# Patient Record
Sex: Female | Born: 1985 | Race: White | Marital: Single | Smoking: Former smoker
Health system: Southern US, Community
[De-identification: ages and names within clinical notes are randomized; demographics above are authoritative.]

## PROBLEM LIST (undated history)

## (undated) HISTORY — PX: KNEE SURGERY: SHX244

## (undated) HISTORY — PX: ANKLE SURGERY: SHX546

---

## 2010-01-27 NOTE — ED Notes (Signed)
Presents to triage with c/o right flank/kidnet pain states this started day before yesterday. States that she also has pain and burining with urination. States that she has also had flu-like symptoms since yesterday.
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
## 2011-10-10 NOTE — Progress Notes (Signed)
Physical Therapy Daily Treatment Note    Patient Information  Ebony Griffin   December 31, 1985  [id number]     Referring Physician: Melvyn Neth, MD   Medical Diagnosis:knee, hip and ankle pain    DATE :10/10/2011    Subjective:   Patient reports pain level of 5 out of 10.  States she always has pain.  States she has started on Mobic and it is helping.    Summary List:  Patient denies any new medical diagnoses or changes in medical conditions since last visit.  Patient denies any operations or procedures since last visit.  Patient denies any new adverse or allergic drug reactions since last visit.  Patient denies any changes to medication list (herbals, prescription, & OTC).     Objective:   Please refer to today's Treatment Flowsheet for exercises / interventions performed today. These addressed any or all of the following: education / ROM / strength / flexibility / tissue healing / tissue mobility / joint mobility / edema control / pain control / postural awareness ( which are necessary to achieve pt's LTFG's).  Started on therapeutic ex in warm water as per flow sheet working on gt activities, jt motion and endurance activities to improve her adls and decrease her c/o pain.    Patient educated on continuing compliance with HEP.       Assessment:   Ebony Griffin is a 26 y.o. female who presents with symptoms consistent with knee, hip and ankle pain. she exhibits decreased ankle rom, increased pain with increased activity.  Tolerated today's session well.      Plan:   Plan to continue to see 2 x /week for 4 week/s for education, therapeutic exercise, manual therapy prn progressing as patient tolerates focusing on increasing strength, reducing deficits in order to improve overall function.        Harlene Ramus, PTA    Therapeutic Exercise (Timed Code): 20 minutes  Therapeutic Activity (Timed Code): 0 minutes  Manual Therapy (Timed Code): 0 minutes  Neuromuscular Re-Ed (Timed Code): 0 minutes  Total Time: 20 minutes
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
## 2011-10-12 NOTE — Progress Notes (Signed)
Physical Therapy Daily Treatment Note    Patient Information  Ebony Griffin   05-21-1986  [id number]     Referring Physician: Melvyn Neth, MD   Medical Diagnosis:knee, ankle and hip pain    DATE :10/12/2011    Subjective:   Patient reports being very sore after rx yesterday and knee is more tender, feels bruised.    Summary List:  Patient denies any new medical diagnoses or changes in medical conditions since last visit.  Patient denies any operations or procedures since last visit.  Patient denies any new adverse or allergic drug reactions since last visit.  Patient denies any changes to medication list (herbals, prescription, & OTC).     Objective:   Please refer to today's Treatment Flowsheet for exercises / interventions performed today. These addressed any or all of the following: education / ROM / strength / flexibility / tissue healing / tissue mobility / joint mobility / edema control / pain control / postural awareness ( which are necessary to achieve pt's LTFG's).  Cont with therapeutic ex in warm water as per flow sheet working on gt activities, balance and endurance to improve her adls and decrease her c/o pain.    Patient educated on continuing compliance with HEP.       Assessment:   Ebony Griffin is a 26 y.o. female who presents with symptoms consistent with knee, ankle and hip pain. She exhibits decreased endurance and stabilization.  She was very tender all around the knee today.  Tight le ms with stretching.  Tolerated today's session well.    Plan:   Plan to continue to see 3 x /week for 3 week/s for education, therapeutic exercise, manual therapy prn progressing as patient tolerates focusing on increasing strength, reducing deficits in order to improve overall function.        Harlene Ramus, PTA    Therapeutic Exercise (Timed Code): 20 minutes  Therapeutic Activity (Timed Code): 0 minutes  Manual Therapy (Timed Code): 0 minutes  Neuromuscular Re-Ed (Timed Code): 0 minutes  Total Time: 20  minutes

## 2011-10-25 NOTE — Progress Notes (Signed)
Called pts home and left a message to call re: cont P.T.

## 2011-10-26 NOTE — Progress Notes (Signed)
dnka

## 2011-11-02 NOTE — Progress Notes (Signed)
Ebony Griffin has been seen for 3 visits of P.T. Since her eval with 2 treatments in the water and 1 on land.  She didn't return for her follow up appts.  I called her home and left a message and she hasn't called to r/s.

## 2012-05-31 NOTE — Progress Notes (Signed)
Patient attended 4 PT visits then did not attend any additional PT visits. Patient's current status is unknown. Patient has been discharged due to non-compliance.

## 2014-09-14 ENCOUNTER — Encounter: Attending: Family | Primary: Family Medicine

## 2015-11-06 ENCOUNTER — Emergency Department: Payer: Self-pay

## 2015-11-06 ENCOUNTER — Emergency Department
Admission: EM | Admit: 2015-11-06 | Discharge: 2015-11-06 | Disposition: A | Discharge status: Home / Self Care | Payer: Self-pay | Attending: Emergency Medicine | Admitting: Emergency Medicine

## 2015-11-06 ENCOUNTER — Encounter: Payer: Self-pay | Admitting: Emergency Medicine

## 2015-11-06 DIAGNOSIS — Y999 Unspecified external cause status: Secondary | ICD-10-CM | POA: Insufficient documentation

## 2015-11-06 DIAGNOSIS — Y929 Unspecified place or not applicable: Secondary | ICD-10-CM | POA: Insufficient documentation

## 2015-11-06 DIAGNOSIS — X501XXA Overexertion from prolonged static or awkward postures, initial encounter: Secondary | ICD-10-CM | POA: Insufficient documentation

## 2015-11-06 DIAGNOSIS — Y9301 Activity, walking, marching and hiking: Secondary | ICD-10-CM | POA: Insufficient documentation

## 2015-11-06 DIAGNOSIS — Z87891 Personal history of nicotine dependence: Secondary | ICD-10-CM | POA: Insufficient documentation

## 2015-11-06 DIAGNOSIS — S93402A Sprain of unspecified ligament of left ankle, initial encounter: Secondary | ICD-10-CM | POA: Insufficient documentation

## 2015-11-06 MED ORDER — IBUPROFEN 600 MG PO TABS: 600.0000 mg | ORAL_TABLET | Freq: Four times a day (QID) | ORAL | Status: AC | PRN

## 2015-11-06 NOTE — ED Notes (Signed)
Pain L ankle since twisted last night.

## 2015-11-06 NOTE — Discharge Instructions (Signed)
Acute Ankle Sprain With Phase I Rehab An acute ankle sprain is a partial or complete tear in one or more of the ligaments of the ankle due to traumatic injury. The severity of the injury depends on both the number of ligaments sprained and the grade of sprain. There are 3 grades of sprains.   A grade 1 sprain is a mild sprain. There is a slight pull without obvious tearing. There is no loss of strength, and the muscle and ligament are the correct length.  A grade 2 sprain is a moderate sprain. There is tearing of fibers within the substance of the ligament where it connects two bones or two cartilages. The length of the ligament is increased, and there is usually decreased strength.  A grade 3 sprain is a complete rupture of the ligament and is uncommon. In addition to the grade of sprain, there are three types of ankle sprains.  Lateral ankle sprains: This is a sprain of one or more of the three ligaments on the outer side (lateral) of the ankle. These are the most common sprains. Medial ankle sprains: There is one large triangular ligament of the inner side (medial) of the ankle that is susceptible to injury. Medial ankle sprains are less common. Syndesmosis, "high ankle," sprains: The syndesmosis is the ligament that connects the two bones of the lower leg. Syndesmosis sprains usually only occur with very severe ankle sprains. SYMPTOMS  Pain, tenderness, and swelling in the ankle, starting at the side of injury that may progress to the whole ankle and foot with time.  "Pop" or tearing sensation at the time of injury.  Bruising that may spread to the heel.  Impaired ability to walk soon after injury. CAUSES   Acute ankle sprains are caused by trauma placed on the ankle that temporarily forces or pries the anklebone (talus) out of its normal socket.  Stretching or tearing of the ligaments that normally hold the joint in place (usually due to a twisting injury). RISK INCREASES  WITH:  Previous ankle sprain.  Sports in which the foot may land awkwardly (i.e., basketball, volleyball, or soccer) or walking or running on uneven or rough surfaces.  Shoes with inadequate support to prevent sideways motion when stress occurs.  Poor strength and flexibility.  Poor balance skills.  Contact sports. PREVENTION   Warm up and stretch properly before activity.  Maintain physical fitness:  Ankle and leg flexibility, muscle strength, and endurance.  Cardiovascular fitness.  Balance training activities.  Use proper technique and have a coach correct improper technique.  Taping, protective strapping, bracing, or high-top tennis shoes may help prevent injury. Initially, tape is best; however, it loses most of its support function within 10 to 15 minutes.  Wear proper-fitted protective shoes (High-top shoes with taping or bracing is more effective than either alone).  Provide the ankle with support during sports and practice activities for 12 months following injury. PROGNOSIS   If treated properly, ankle sprains can be expected to recover completely; however, the length of recovery depends on the degree of injury.  A grade 1 sprain usually heals enough in 5 to 7 days to allow modified activity and requires an average of 6 weeks to heal completely.  A grade 2 sprain requires 6 to 10 weeks to heal completely.  A grade 3 sprain requires 12 to 16 weeks to heal.  A syndesmosis sprain often takes more than 3 months to heal. RELATED COMPLICATIONS   Frequent recurrence of symptoms may   result in a chronic problem. Appropriately addressing the problem the first time decreases the frequency of recurrence and optimizes healing time. Severity of the initial sprain does not predict the likelihood of later instability. °· Injury to other structures (bone, cartilage, or tendon). °· A chronically unstable or arthritic ankle joint is a possibility with repeated  sprains. °TREATMENT °Treatment initially involves the use of ice, medication, and compression bandages to help reduce pain and inflammation. Ankle sprains are usually immobilized in a walking cast or boot to allow for healing. Crutches may be recommended to reduce pressure on the injury. After immobilization, strengthening and stretching exercises may be necessary to regain strength and a full range of motion. Surgery is rarely needed to treat ankle sprains. °MEDICATION  °· Nonsteroidal anti-inflammatory medications, such as aspirin and ibuprofen (do not take for the first 3 days after injury or within 7 days before surgery), or other minor pain relievers, such as acetaminophen, are often recommended. Take these as directed by your caregiver. Contact your caregiver immediately if any bleeding, stomach upset, or signs of an allergic reaction occur from these medications. °· Ointments applied to the skin may be helpful. °· Pain relievers may be prescribed as necessary by your caregiver. Do not take prescription pain medication for longer than 4 to 7 days. Use only as directed and only as much as you need. °HEAT AND COLD °· Cold treatment (icing) is used to relieve pain and reduce inflammation for acute and chronic cases. Cold should be applied for 10 to 15 minutes every 2 to 3 hours for inflammation and pain and immediately after any activity that aggravates your symptoms. Use ice packs or an ice massage. °· Heat treatment may be used before performing stretching and strengthening activities prescribed by your caregiver. Use a heat pack or a warm soak. °SEEK IMMEDIATE MEDICAL CARE IF:  °· Pain, swelling, or bruising worsens despite treatment. °· You experience pain, numbness, discoloration, or coldness in the foot or toes. °· New, unexplained symptoms develop (drugs used in treatment may produce side effects.) °EXERCISES  °PHASE I EXERCISES °RANGE OF MOTION (ROM) AND STRETCHING EXERCISES - Ankle Sprain, Acute Phase I,  Weeks 1 to 2 °These exercises may help you when beginning to restore flexibility in your ankle. You will likely work on these exercises for the 1 to 2 weeks after your injury. Once your physician, physical therapist, or athletic trainer sees adequate progress, he or she will advance your exercises. While completing these exercises, remember:  °· Restoring tissue flexibility helps normal motion to return to the joints. This allows healthier, less painful movement and activity. °· An effective stretch should be held for at least 30 seconds. °· A stretch should never be painful. You should only feel a gentle lengthening or release in the stretched tissue. °RANGE OF MOTION - Dorsi/Plantar Flexion °· While sitting with your right / left knee straight, draw the top of your foot upwards by flexing your ankle. Then reverse the motion, pointing your toes downward. °· Hold each position for __________ seconds. °· After completing your first set of exercises, repeat this exercise with your knee bent. °Repeat __________ times. Complete this exercise __________ times per day.  °RANGE OF MOTION - Ankle Alphabet °· Imagine your right / left big toe is a pen. °· Keeping your hip and knee still, write out the entire alphabet with your "pen." Make the letters as large as you can without increasing any discomfort. °Repeat __________ times. Complete this exercise __________   times per day.  °STRENGTHENING EXERCISES - Ankle Sprain, Acute -Phase I, Weeks 1 to 2 °These exercises may help you when beginning to restore strength in your ankle. You will likely work on these exercises for 1 to 2 weeks after your injury. Once your physician, physical therapist, or athletic trainer sees adequate progress, he or she will advance your exercises. While completing these exercises, remember:  °· Muscles can gain both the endurance and the strength needed for everyday activities through controlled exercises. °· Complete these exercises as instructed by  your physician, physical therapist, or athletic trainer. Progress the resistance and repetitions only as guided. °· You may experience muscle soreness or fatigue, but the pain or discomfort you are trying to eliminate should never worsen during these exercises. If this pain does worsen, stop and make certain you are following the directions exactly. If the pain is still present after adjustments, discontinue the exercise until you can discuss the trouble with your clinician. °STRENGTH - Dorsiflexors °· Secure a rubber exercise band/tubing to a fixed object (i.e., table, pole) and loop the other end around your right / left foot. °· Sit on the floor facing the fixed object. The band/tubing should be slightly tense when your foot is relaxed. °· Slowly draw your foot back toward you using your ankle and toes. °· Hold this position for __________ seconds. Slowly release the tension in the band and return your foot to the starting position. °Repeat __________ times. Complete this exercise __________ times per day.  °STRENGTH - Plantar-flexors  °· Sit with your right / left leg extended. Holding onto both ends of a rubber exercise band/tubing, loop it around the ball of your foot. Keep a slight tension in the band. °· Slowly push your toes away from you, pointing them downward. °· Hold this position for __________ seconds. Return slowly, controlling the tension in the band/tubing. °Repeat __________ times. Complete this exercise __________ times per day.  °STRENGTH - Ankle Eversion °· Secure one end of a rubber exercise band/tubing to a fixed object (table, pole). Loop the other end around your foot just before your toes. °· Place your fists between your knees. This will focus your strengthening at your ankle. °· Drawing the band/tubing across your opposite foot, slowly, pull your little toe out and up. Make sure the band/tubing is positioned to resist the entire motion. °· Hold this position for __________ seconds. °Have  your muscles resist the band/tubing as it slowly pulls your foot back to the starting position.  °Repeat __________ times. Complete this exercise __________ times per day.  °STRENGTH - Ankle Inversion °· Secure one end of a rubber exercise band/tubing to a fixed object (table, pole). Loop the other end around your foot just before your toes. °· Place your fists between your knees. This will focus your strengthening at your ankle. °· Slowly, pull your big toe up and in, making sure the band/tubing is positioned to resist the entire motion. °· Hold this position for __________ seconds. °· Have your muscles resist the band/tubing as it slowly pulls your foot back to the starting position. °Repeat __________ times. Complete this exercises __________ times per day.  °STRENGTH - Towel Curls °· Sit in a chair positioned on a non-carpeted surface. °· Place your right / left foot on a towel, keeping your heel on the floor. °· Pull the towel toward your heel by only curling your toes. Keep your heel on the floor. °· If instructed by your physician, physical therapist,   or athletic trainer, add weight to the end of the towel. Repeat __________ times. Complete this exercise __________ times per day.   This information is not intended to replace advice given to you by your health care provider. Make sure you discuss any questions you have with your health care provider.   Document Released: 12/07/2004 Document Revised: 05/29/2014 Document Reviewed: 08/20/2008 Elsevier Interactive Patient Education 2016 [employer]  Adult nurselastic Bandage and RICE WHAT DOES AN ELASTIC BANDAGE DO? Elastic bandages come in different shapes and sizes. They generally provide support to your injury and reduce swelling while you are healing, but they can perform different functions. Your health care provider will help you to decide what is best for your protection, recovery, or rehabilitation following an injury. WHAT ARE SOME GENERAL TIPS FOR  USING AN ELASTIC BANDAGE?  Use the bandage as directed by the maker of the bandage that you are using.  Do not wrap the bandage too tightly. This may cut off the circulation in the arm or leg in the area below the bandage.  If part of your body beyond the bandage becomes blue, numb, cold, swollen, or is more painful, your bandage is most likely too tight. If this occurs, remove your bandage and reapply it more loosely.  See your health care provider if the bandage seems to be making your problems worse rather than better.  An elastic bandage should be removed and reapplied every 3-4 hours or as directed by your health care provider. WHAT IS RICE? The routine care of many injuries includes rest, ice, compression, and elevation (RICE therapy).  Rest Rest is required to allow your body to heal. Generally, you can resume your routine activities when you are comfortable and have been given permission by your health care provider. Ice Icing your injury helps to keep the swelling down and it reduces pain. Do not apply ice directly to your skin.  Put ice in a plastic bag.  Place a towel between your skin and the bag.  Leave the ice on for 20 minutes, 2-3 times per day. Do this for as long as you are directed by your health care provider. Compression Compression helps to keep swelling down, gives support, and helps with discomfort. Compression may be done with an elastic bandage. Elevation Elevation helps to reduce swelling and it decreases pain. If possible, your injured area should be placed at or above the level of your heart or the center of your chest. WHEN SHOULD I SEEK MEDICAL CARE? You should seek medical care if:  You have persistent pain and swelling.  Your symptoms are getting worse rather than improving. These symptoms may indicate that further evaluation or further X-rays are needed. Sometimes, X-rays may not show a small broken bone (fracture) until a number of days later. Make  a follow-up appointment with your health care provider. Ask when your X-ray results will be ready. Make sure that you get your X-ray results. WHEN SHOULD I SEEK IMMEDIATE MEDICAL CARE? You should seek immediate medical care if:  You have a sudden onset of severe pain at or below the area of your injury.  You develop redness or increased swelling around your injury.  You have tingling or numbness at or below the area of your injury that does not improve after you remove the elastic bandage.   This information is not intended to replace advice given to you by your health care provider. Make sure you discuss any questions you have with your health  care provider.   Document Released: 10/28/2001 Document Revised: 01/27/2015 Document Reviewed: 12/22/2013 Elsevier Interactive Patient Education [employer]

## 2015-11-06 NOTE — ED Provider Notes (Signed)
[facility] Emergency Department Provider Note  ____________________________________________  Time seen: Approximately 12:16 PM  I have reviewed the triage vital signs and the nursing notes.   HISTORY  Chief Complaint Ankle Pain    HPI Lynn Mcfarland is a 30 y.o. female , NAD, presents to the emergency department with 10 hour history of left ankle pain. States she twisted the left ankle around 2 AM when walking out of a restaurant. Stepped in a pot hole causing her ankle to twist. Has had pain and swelling since the incident. Denies any numbness, weakness, tingling. Has not had any bruising, redness, skin sores, open wounds or lacerations to the area. Has been able to bear weight but with significant pain about the left ankle. No previous injuries to this ankle.    History reviewed. No pertinent past medical history.  There are no active problems to display for this patient.   Past Surgical History  Procedure Laterality Date  . Ankle surgery    . Knee surgery      Current Outpatient Rx  Name  Route  Sig  Dispense  Refill  . ibuprofen (ADVIL,MOTRIN) 600 MG tablet   Oral   Take 1 tablet (600 mg total) by mouth every 6 (six) hours as needed.   30 tablet   0     Allergies Review of patient's allergies indicates no known allergies.  No family history on file.  Social History Social History  Substance Use Topics  . Smoking status: Former Games developer  . Smokeless tobacco: None  . Alcohol Use: Yes     Review of Systems  Constitutional: No fever/chills, fatigue Eyes: No visual changes.  Cardiovascular: No chest pain. Respiratory:  No shortness of breath. Musculoskeletal: Positive for left ankle pain. Skin: Positive swelling left ankle. Negative for rash, bruising, open wounds. Neurological: Negative for headaches, focal weakness or numbness. No tingling. 10-point ROS otherwise  negative.  ____________________________________________   PHYSICAL EXAM:  VITAL SIGNS: ED Triage Vitals  Enc Vitals Group     BP 11/06/15 1209 136/78 mmHg     Pulse Rate 11/06/15 1209 72     Resp 11/06/15 1209 18     Temp 11/06/15 1209 98 F (36.7 C)     Temp Source 11/06/15 1209 Oral     SpO2 11/06/15 1209 100 %     Weight 11/06/15 1209 195 lb (88.451 kg)     Height 11/06/15 1209  (1.676 m)     Head Cir --      Peak Flow --      Pain Score 11/06/15 1210 7     Pain Loc --      Pain Edu? --      Excl. in GC? --      Constitutional: Alert and oriented. Well appearing and in no acute distress. Eyes: Conjunctivae are normal. Head: Atraumatic. Cardiovascular:  Good peripheral circulation with 2+ pulses in the left lower extremity. Capillary refill is brisk in the left lower extremity.  Respiratory: Normal respiratory effort without tachypnea or retractions.  Musculoskeletal: Tenderness to palpation about the left lateral ankle without any bony abnormalities or crepitus noted. No laxity with anterior posterior drawer of the left ankle. No laxity with varus or valgus stress about left ankle. Full range of motion of the left ankle actively. No lower extremity tenderness nor edema.  No joint effusions. Neurologic:  Normal speech and language. No gross focal neurologic deficits are appreciated. Sensation to light touch about the left  lower extremity is grossly intact. Skin:  Trace clear ecchymosis is noted about the left lateral ankle. Skin is warm, dry and intact. No rash, redness, swelling, open wounds noted. Psychiatric: Mood and affect are normal. Speech and behavior are normal. Patient exhibits appropriate insight and judgement.   ____________________________________________   LABS  None ____________________________________________  EKG  None ____________________________________________  RADIOLOGY I have personally viewed and evaluated these images (plain  radiographs) as part of my medical decision making, as well as reviewing the written report by the radiologist.  Dg Ankle Complete Left  11/06/2015  CLINICAL DATA:  Acute left ankle pain after twisting injury today. Initial encounter. EXAM: LEFT ANKLE COMPLETE - 3+ VIEW COMPARISON:  None. FINDINGS: There is no evidence of fracture, dislocation, or joint effusion. There is no evidence of arthropathy or other focal bone abnormality. Soft tissues are unremarkable. IMPRESSION: Normal left ankle. Electronically Signed   By: Lupita RaiderJames  Green Jr, M.D.   On: 11/06/2015 12:55    ____________________________________________    PROCEDURES  Procedure(s) performed: None    Medications - No data to display   ____________________________________________   INITIAL IMPRESSION / ASSESSMENT AND PLAN / ED COURSE  Pertinent imaging results that were available during my care of the patient were reviewed by me and considered in my medical decision making (see chart for details).  Patient's diagnosis is consistent with left ankle sprain. Patient was placed in an Ace wrap and given crutches for supportive care. Patient will be discharged home with prescriptions for ibuprofen to take as directed. Patient to apply ice to the affected areas 20 minutes 3-4 times daily and elevate when not ambulating. Patient is to follow up with Dr. Joice LoftsPoggi in orthopedics if symptoms persist past this treatment course. Patient is given ED precautions to return to the ED for any worsening or new symptoms.      ____________________________________________  FINAL CLINICAL IMPRESSION(S) / ED DIAGNOSES  Final diagnoses:  Left ankle sprain, initial encounter      NEW MEDICATIONS STARTED DURING THIS VISIT:  New Prescriptions   IBUPROFEN (ADVIL,MOTRIN) 600 MG TABLET    Take 1 tablet (600 mg total) by mouth every 6 (six) hours as needed.         Hope PigeonJami L Elika Godar, PA-C 11/06/15 1308  Sharyn CreamerMark Quale, MD 11/06/15 1527

## 2015-11-06 NOTE — ED Notes (Addendum)
Pt presents with c/o LEFT ankle pain s/p twisiting it last night around 2am. Pt reports pain and swelling; CMS intact.

## 2017-07-27 ENCOUNTER — Emergency Department: Admit: 2017-07-28 | Payer: PRIVATE HEALTH INSURANCE | Primary: Family Medicine

## 2017-07-27 DIAGNOSIS — J209 Acute bronchitis, unspecified: Secondary | ICD-10-CM

## 2017-07-27 NOTE — ED Triage Notes (Signed)
Pt c/o cough and congestion, started x 4 days ago, seen at urgent care x 2 days ago and is not any better.

## 2017-07-28 ENCOUNTER — Inpatient Hospital Stay
Admit: 2017-07-28 | Discharge: 2017-07-28 | Disposition: A | Discharge status: Home / Self Care | Payer: PRIVATE HEALTH INSURANCE | Attending: Emergency Medicine

## 2017-07-28 LAB — INFLUENZA A & B AG (RAPID TEST)
Influenza A Antigen: NEGATIVE
Influenza B Antigen: NEGATIVE

## 2017-07-28 MED ORDER — ALBUTEROL SULFATE HFA 90 MCG/ACTUATION AEROSOL INHALER
90 mcg/actuation | RESPIRATORY_TRACT | Status: AC
Start: 2017-07-28 — End: 2017-07-28
  Administered 2017-07-28: 08:00:00 via RESPIRATORY_TRACT

## 2017-07-28 MED ORDER — METHYLPREDNISOLONE (PF) 125 MG/2 ML IJ SOLR
125 mg/2 mL | INTRAMUSCULAR | Status: AC
Start: 2017-07-28 — End: 2017-07-28
  Administered 2017-07-28: 07:00:00 via INTRAMUSCULAR

## 2017-07-28 MED ORDER — ALBUTEROL SULFATE 2.5 MG/0.5 ML NEB SOLUTION
2.50.5 mg/0.5 mL | RESPIRATORY_TRACT | Status: AC
Start: 2017-07-28 — End: 2017-07-28
  Administered 2017-07-28: 07:00:00 via RESPIRATORY_TRACT

## 2017-07-28 MED ORDER — PREDNISONE 20 MG TAB
20 mg | ORAL_TABLET | Freq: Every day | ORAL | 0 refills | Status: AC
Start: 2017-07-28 — End: 2017-08-02

## 2017-07-28 MED ORDER — DOXYCYCLINE 100 MG CAP
100 mg | ORAL_CAPSULE | Freq: Two times a day (BID) | ORAL | 0 refills | Status: AC
Start: 2017-07-28 — End: 2017-08-04

## 2017-07-28 MED ORDER — METHYLPREDNISOLONE (PF) 125 MG/2 ML IJ SOLR
125 mg/2 mL | INTRAMUSCULAR | Status: DC
Start: 2017-07-28 — End: 2017-07-28

## 2017-07-28 MED FILL — PROAIR HFA 90 MCG/ACTUATION AEROSOL INHALER: 90 mcg/actuation | RESPIRATORY_TRACT | Qty: 8.5

## 2017-07-28 MED FILL — SOLU-MEDROL (PF) 125 MG/2 ML SOLUTION FOR INJECTION: 125 mg/2 mL | INTRAMUSCULAR | Qty: 2

## 2017-07-28 NOTE — ED Notes (Signed)
RT notified.

## 2017-07-28 NOTE — ED Provider Notes (Addendum)
The history is provided by the patient.   Cough   This is a new (Pt has had a cough and congestion for the last 5 days. No fever or chills. No sputum with the cough.  HAs had nasal congestion with clear drainage.  ) problem.        Past Medical History:   Diagnosis Date   ??? UTI (urinary tract infection)        Past Surgical History:   Procedure Laterality Date   ??? HX ORTHOPAEDIC      right ankle ORIF   ??? HX ORTHOPAEDIC      patellar Fx surgery x2 to left knee         History reviewed. No pertinent family history.    Social History     Socioeconomic History   ??? Marital status: SINGLE     Spouse name: Not on file   ??? Number of children: Not on file   ??? Years of education: Not on file   ??? Highest education level: Not on file   Social Needs   ??? Financial resource strain: Not on file   ??? Food insecurity - worry: Not on file   ??? Food insecurity - inability: Not on file   ??? Transportation needs - medical: Not on file   ??? Transportation needs - non-medical: Not on file   Occupational History   ??? Not on file   Tobacco Use   ??? Smoking status: Current Every Day Smoker     Packs/day: 0.50     Years: 2.00     Pack years: 1.00   ??? Smokeless tobacco: Never Used   Substance and Sexual Activity   ??? Alcohol use: No   ??? Drug use: No   ??? Sexual activity: Yes     Partners: Female   Other Topics Concern   ??? Not on file   Social History Narrative   ??? Not on file         ALLERGIES: Pcn [penicillins]    Review of Systems   Respiratory: Positive for cough.        Vitals:    07/27/17 2259   BP: 127/82   Pulse: 99   Resp: 18   Temp: 98.3 ??F (36.8 ??C)   SpO2: 100%   Weight: 88.5 kg (195 lb)   Height: 5\' 6"  (1.676 m)            Physical Exam   Constitutional: She is oriented to person, place, and time. She appears well-developed and well-nourished.   HENT:   Head: Normocephalic.   Right Ear: External ear normal.   Left Ear: External ear normal.   Nose: Nose normal.   Mouth/Throat: Oropharynx is clear and moist.    Eyes: EOM are normal. Pupils are equal, round, and reactive to light.   Neck: Normal range of motion. Neck supple.   Cardiovascular: Normal rate, regular rhythm and normal heart sounds.   Pulmonary/Chest: Effort normal. She has wheezes. She has no rales.   Abdominal: Soft. Bowel sounds are normal. There is no tenderness.   Musculoskeletal: Normal range of motion. She exhibits no edema.   Lymphadenopathy:     She has no cervical adenopathy.   Neurological: She is alert and oriented to person, place, and time.   Skin: Skin is warm and dry. No rash noted.   Psychiatric: She has a normal mood and affect.   Nursing note and vitals reviewed.  MDM  Number of Diagnoses or Management Options  Acute bronchitis, unspecified organism: new and requires workup  Acute bronchospasm: new and requires workup     Amount and/or Complexity of Data Reviewed  Clinical lab tests: ordered and reviewed  Tests in the radiology section of CPT??: ordered and reviewed           Procedures

## 2017-09-29 IMAGING — DX DG ANKLE COMPLETE 3+V*L*
3 series · 3 of 3 positions shown · non-contrast
Comparison: None.

CLINICAL DATA: Acute left ankle pain after twisting injury today.
Initial encounter.

EXAM:
LEFT ANKLE COMPLETE - 3+ VIEW

[ankle ap]
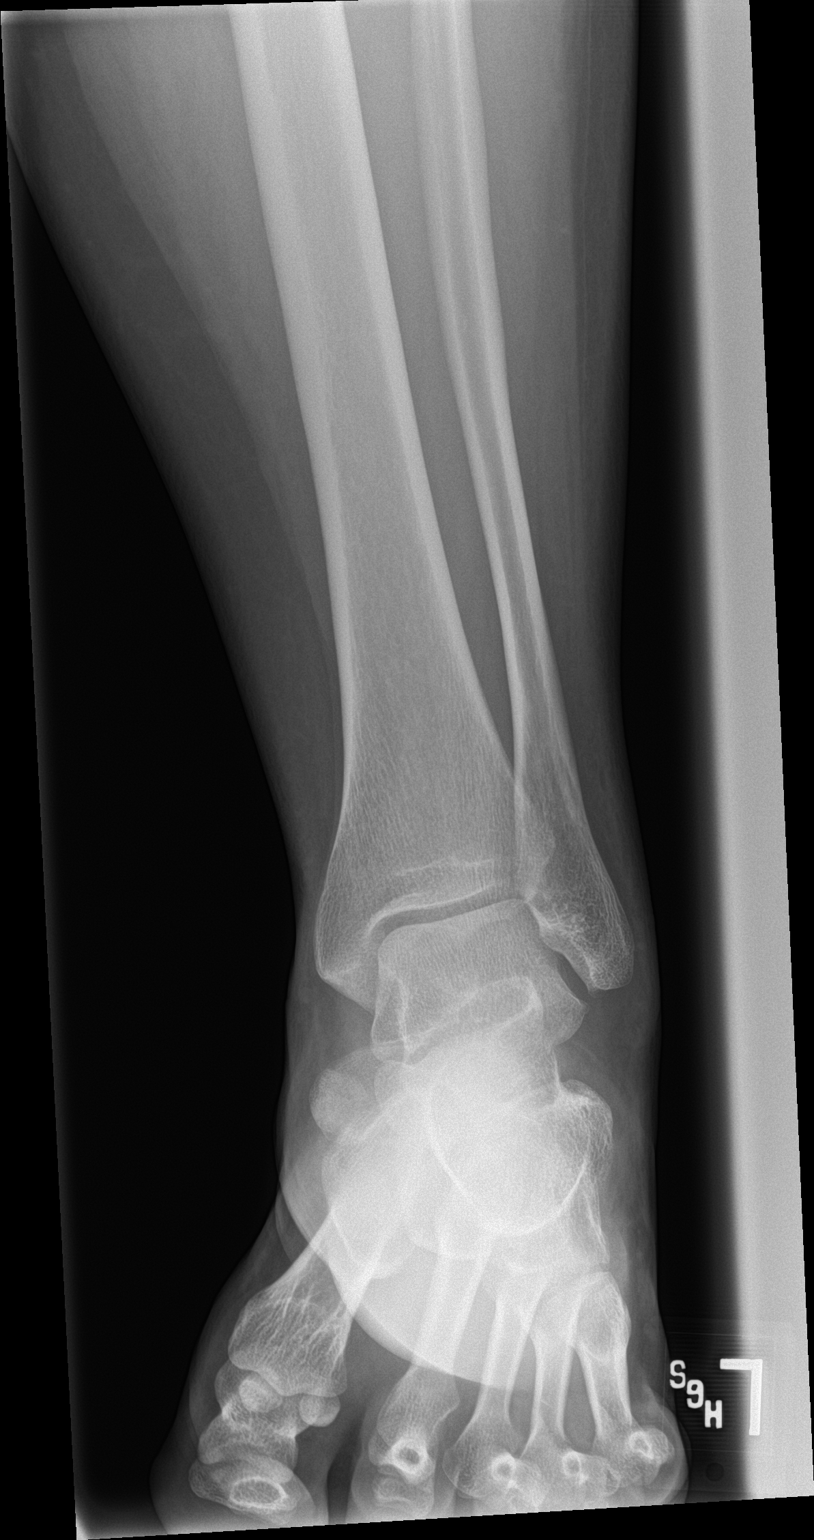

[ankle obl]
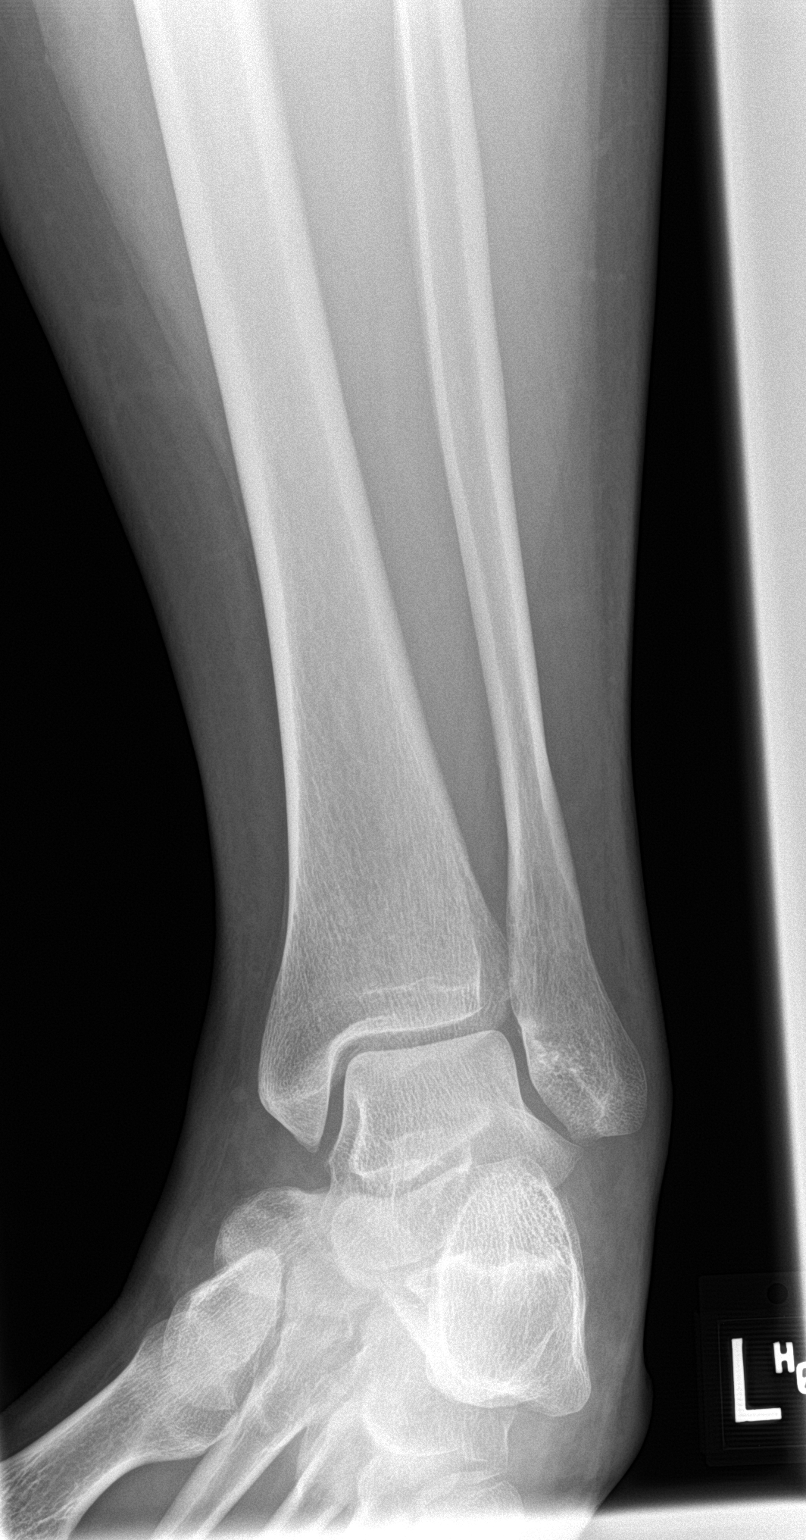

[ankle lat]
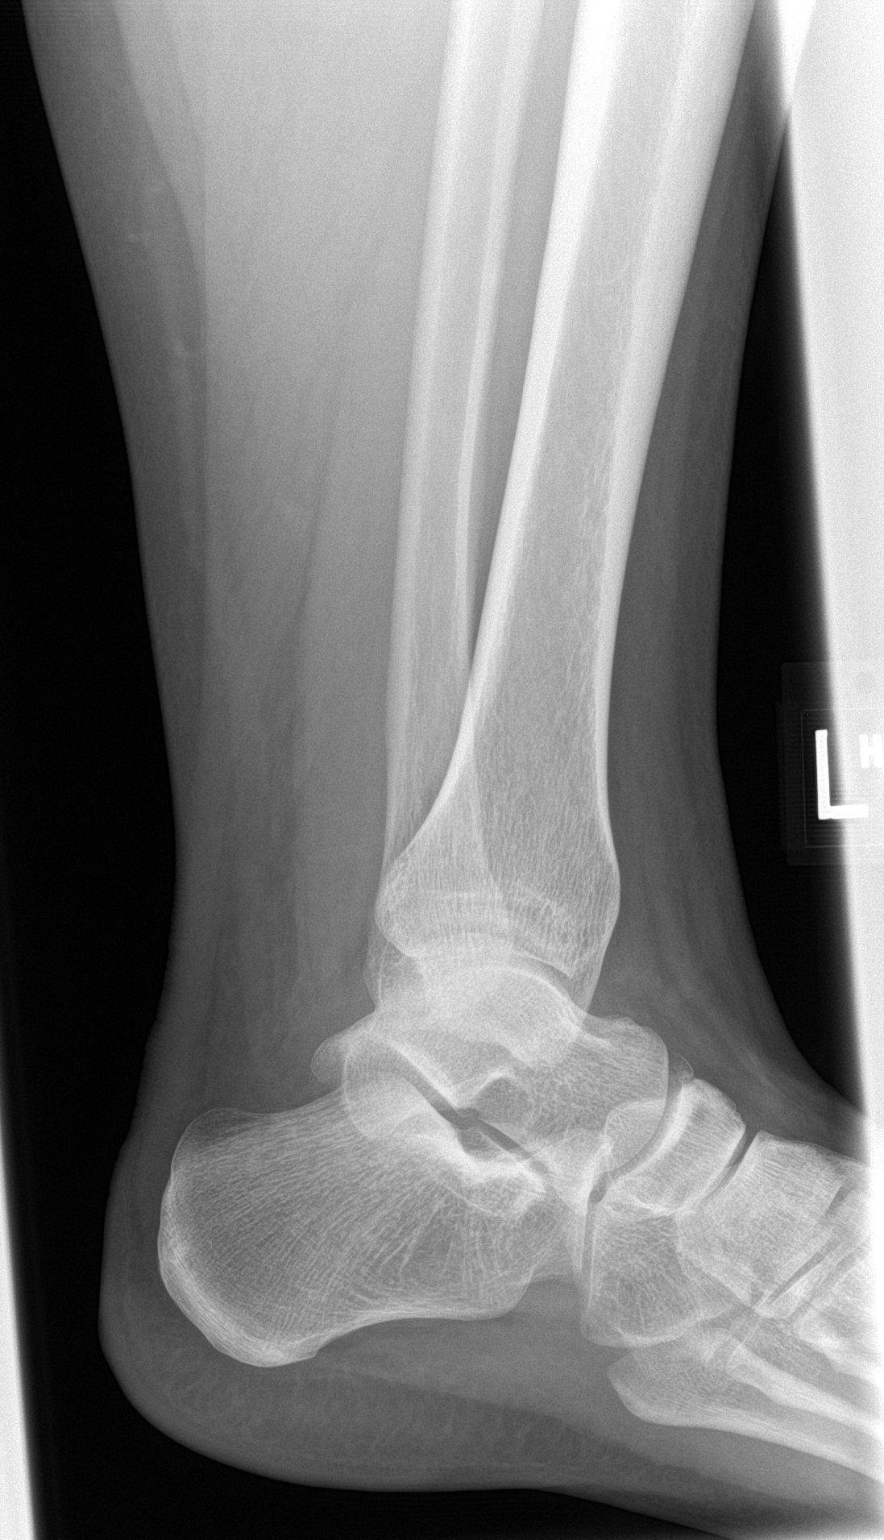

[3 of 3 positions shown; findings below may reference images not displayed]

FINDINGS: There is no evidence of fracture, dislocation, or joint effusion.
There is no evidence of arthropathy or other focal bone abnormality.
Soft tissues are unremarkable.
IMPRESSION: Normal left ankle.
